# Patient Record
Sex: Male | Born: 2000 | Race: White | Hispanic: No | Marital: Single | Smoking: Never smoker
Health system: Southern US, Community
[De-identification: ages and names within clinical notes are randomized; demographics above are authoritative.]

## PROBLEM LIST (undated history)

## (undated) DIAGNOSIS — R480 Dyslexia and alexia: Secondary | ICD-10-CM

## (undated) DIAGNOSIS — M24111 Other articular cartilage disorders, right shoulder: Secondary | ICD-10-CM

## (undated) DIAGNOSIS — S46911A Strain of unspecified muscle, fascia and tendon at shoulder and upper arm level, right arm, initial encounter: Secondary | ICD-10-CM

## (undated) DIAGNOSIS — F909 Attention-deficit hyperactivity disorder, unspecified type: Secondary | ICD-10-CM

## (undated) DIAGNOSIS — H9325 Central auditory processing disorder: Secondary | ICD-10-CM

---
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
## 2019-10-02 DIAGNOSIS — Z1152 Encounter for screening for COVID-19: Secondary | ICD-10-CM | POA: Diagnosis not present

## 2020-02-13 DIAGNOSIS — K629 Disease of anus and rectum, unspecified: Secondary | ICD-10-CM | POA: Diagnosis not present

## 2020-02-13 DIAGNOSIS — L739 Follicular disorder, unspecified: Secondary | ICD-10-CM | POA: Diagnosis not present

## 2020-03-25 DIAGNOSIS — S61210A Laceration without foreign body of right index finger without damage to nail, initial encounter: Secondary | ICD-10-CM | POA: Diagnosis not present

## 2021-01-23 DIAGNOSIS — S6990XA Unspecified injury of unspecified wrist, hand and finger(s), initial encounter: Secondary | ICD-10-CM | POA: Diagnosis not present

## 2021-02-25 DIAGNOSIS — L309 Dermatitis, unspecified: Secondary | ICD-10-CM | POA: Diagnosis not present

## 2021-02-25 DIAGNOSIS — L739 Follicular disorder, unspecified: Secondary | ICD-10-CM | POA: Diagnosis not present
